# Patient Record
Sex: Male | Born: 1980
Health system: Southern US, Community
[De-identification: ages and names within clinical notes are randomized; demographics above are authoritative.]

## PROBLEM LIST (undated history)

## (undated) DIAGNOSIS — F410 Panic disorder [episodic paroxysmal anxiety] without agoraphobia: Secondary | ICD-10-CM

---

## 2004-10-10 ENCOUNTER — Emergency Department (HOSPITAL_COMMUNITY): Admission: EM | Admit: 2004-10-10 | Discharge: 2004-10-10 | Payer: Self-pay | Admitting: Emergency Medicine

## 2004-10-11 ENCOUNTER — Emergency Department (HOSPITAL_COMMUNITY): Admission: EM | Admit: 2004-10-11 | Discharge: 2004-10-11 | Payer: Self-pay | Admitting: Emergency Medicine

## 2004-12-17 ENCOUNTER — Emergency Department (HOSPITAL_COMMUNITY): Admission: EM | Admit: 2004-12-17 | Discharge: 2004-12-17 | Payer: Self-pay | Admitting: Emergency Medicine

## 2006-01-26 ENCOUNTER — Emergency Department (HOSPITAL_COMMUNITY): Admission: EM | Admit: 2006-01-26 | Discharge: 2006-01-26 | Payer: Self-pay | Admitting: Emergency Medicine

## 2007-08-21 ENCOUNTER — Emergency Department (HOSPITAL_COMMUNITY): Admission: EM | Admit: 2007-08-21 | Discharge: 2007-08-21 | Payer: Self-pay | Admitting: Emergency Medicine

## 2007-09-30 ENCOUNTER — Encounter (INDEPENDENT_AMBULATORY_CARE_PROVIDER_SITE_OTHER): Payer: Self-pay | Admitting: Nurse Practitioner

## 2007-09-30 ENCOUNTER — Ambulatory Visit: Payer: Self-pay | Admitting: *Deleted

## 2007-09-30 ENCOUNTER — Ambulatory Visit: Payer: Self-pay | Admitting: Family Medicine

## 2007-09-30 LAB — CONVERTED CEMR LAB
AST: 29 units/L (ref 0–37)
Albumin: 4.7 g/dL (ref 3.5–5.2)
Alkaline Phosphatase: 81 units/L (ref 39–117)
Calcium: 9.9 mg/dL (ref 8.4–10.5)
Chloride: 103 meq/L (ref 96–112)
Eosinophils Absolute: 0 10*3/uL — ABNORMAL LOW (ref 0.2–0.7)
Glucose, Bld: 93 mg/dL (ref 70–99)
Lymphocytes Relative: 51 % — ABNORMAL HIGH (ref 12–46)
Lymphs Abs: 2 10*3/uL (ref 0.7–4.0)
MCV: 88 fL (ref 78.0–100.0)
Neutro Abs: 1.5 10*3/uL — ABNORMAL LOW (ref 1.7–7.7)
Neutrophils Relative %: 37 % — ABNORMAL LOW (ref 43–77)
Platelets: 251 10*3/uL (ref 150–400)
Potassium: 4.4 meq/L (ref 3.5–5.3)
RBC: 5.85 M/uL — ABNORMAL HIGH (ref 4.22–5.81)
Sodium: 139 meq/L (ref 135–145)
Total Protein: 8.3 g/dL (ref 6.0–8.3)
WBC: 3.9 10*3/uL — ABNORMAL LOW (ref 4.0–10.5)

## 2007-10-06 ENCOUNTER — Ambulatory Visit (HOSPITAL_COMMUNITY): Admission: RE | Admit: 2007-10-06 | Discharge: 2007-10-06 | Payer: Self-pay | Admitting: Nurse Practitioner

## 2008-04-13 ENCOUNTER — Emergency Department (HOSPITAL_COMMUNITY): Admission: EM | Admit: 2008-04-13 | Discharge: 2008-04-13 | Payer: Self-pay | Admitting: Emergency Medicine

## 2008-07-05 ENCOUNTER — Ambulatory Visit: Payer: Self-pay | Admitting: Internal Medicine

## 2008-07-29 ENCOUNTER — Emergency Department (HOSPITAL_COMMUNITY): Admission: EM | Admit: 2008-07-29 | Discharge: 2008-07-29 | Payer: Self-pay | Admitting: Emergency Medicine

## 2008-08-23 ENCOUNTER — Emergency Department (HOSPITAL_COMMUNITY): Admission: EM | Admit: 2008-08-23 | Discharge: 2008-08-23 | Payer: Self-pay | Admitting: Emergency Medicine

## 2009-03-14 ENCOUNTER — Emergency Department (HOSPITAL_COMMUNITY): Admission: EM | Admit: 2009-03-14 | Discharge: 2009-03-14 | Payer: Self-pay | Admitting: Emergency Medicine

## 2009-05-03 ENCOUNTER — Emergency Department (HOSPITAL_COMMUNITY): Admission: EM | Admit: 2009-05-03 | Discharge: 2009-05-03 | Payer: Self-pay | Admitting: Emergency Medicine

## 2009-05-26 ENCOUNTER — Emergency Department (HOSPITAL_COMMUNITY): Admission: EM | Admit: 2009-05-26 | Discharge: 2009-05-26 | Payer: Self-pay | Admitting: Emergency Medicine

## 2009-07-13 ENCOUNTER — Emergency Department (HOSPITAL_COMMUNITY): Admission: EM | Admit: 2009-07-13 | Discharge: 2009-07-15 | Payer: Self-pay | Admitting: Emergency Medicine

## 2009-08-08 ENCOUNTER — Emergency Department (HOSPITAL_COMMUNITY): Admission: EM | Admit: 2009-08-08 | Discharge: 2009-08-08 | Payer: Self-pay | Admitting: Emergency Medicine

## 2009-08-10 ENCOUNTER — Emergency Department (HOSPITAL_COMMUNITY): Admission: EM | Admit: 2009-08-10 | Discharge: 2009-08-10 | Payer: Self-pay | Admitting: Emergency Medicine

## 2009-09-08 ENCOUNTER — Emergency Department (HOSPITAL_COMMUNITY): Admission: EM | Admit: 2009-09-08 | Discharge: 2009-09-08 | Payer: Self-pay | Admitting: Emergency Medicine

## 2009-10-09 ENCOUNTER — Emergency Department (HOSPITAL_COMMUNITY): Admission: EM | Admit: 2009-10-09 | Discharge: 2009-10-09 | Payer: Self-pay | Admitting: Emergency Medicine

## 2009-12-02 ENCOUNTER — Emergency Department (HOSPITAL_COMMUNITY): Admission: EM | Admit: 2009-12-02 | Discharge: 2009-12-02 | Payer: Self-pay | Admitting: Emergency Medicine

## 2010-08-30 ENCOUNTER — Emergency Department (HOSPITAL_COMMUNITY): Admission: EM | Admit: 2010-08-30 | Discharge: 2010-08-30 | Payer: Self-pay | Admitting: Emergency Medicine

## 2011-02-07 LAB — POCT I-STAT, CHEM 8
BUN: 10 mg/dL (ref 6–23)
Calcium, Ion: 1.19 mmol/L (ref 1.12–1.32)
Chloride: 104 mEq/L (ref 96–112)
Glucose, Bld: 96 mg/dL (ref 70–99)
TCO2: 27 mmol/L (ref 0–100)

## 2011-02-07 LAB — GLUCOSE, CAPILLARY: Glucose-Capillary: 101 mg/dL — ABNORMAL HIGH (ref 70–99)

## 2011-08-06 LAB — COMPREHENSIVE METABOLIC PANEL
ALT: 37
Calcium: 9.3
GFR calc Af Amer: >60
Glucose, Bld: 102 — ABNORMAL HIGH
Sodium: 138
Total Protein: 7.6

## 2011-08-06 LAB — BASIC METABOLIC PANEL
BUN: 11
CO2: 29
Chloride: 102
Creatinine, Ser: 1.03

## 2011-08-06 LAB — POCT CARDIAC MARKERS
CKMB, poc: 4.8
CKMB, poc: 5.6
Myoglobin, poc: 125
Troponin i, poc: <0.05

## 2011-08-06 LAB — DIFFERENTIAL
Basophils Absolute: 0
Basophils Relative: 1
Eosinophils Absolute: 0.1
Eosinophils Absolute: 0
Lymphs Abs: 1.7
Monocytes Relative: 10
Neutro Abs: 1.6 — ABNORMAL LOW
Neutrophils Relative %: 35 — ABNORMAL LOW
Neutrophils Relative %: 43

## 2011-08-06 LAB — CBC
Hemoglobin: 15.3
MCHC: 32.4
MCHC: 32.4
MCV: 87.1
Platelets: 251
RDW: 14
RDW: 14.1

## 2011-11-18 ENCOUNTER — Emergency Department (HOSPITAL_COMMUNITY)
Admission: EM | Admit: 2011-11-18 | Discharge: 2011-11-18 | Disposition: A | Discharge status: Home / Self Care | Payer: Self-pay | Attending: Emergency Medicine | Admitting: Emergency Medicine

## 2011-11-18 ENCOUNTER — Encounter (HOSPITAL_COMMUNITY): Payer: Self-pay | Admitting: Emergency Medicine

## 2011-11-18 DIAGNOSIS — F411 Generalized anxiety disorder: Secondary | ICD-10-CM | POA: Insufficient documentation

## 2011-11-18 DIAGNOSIS — Z79899 Other long term (current) drug therapy: Secondary | ICD-10-CM | POA: Insufficient documentation

## 2011-11-18 DIAGNOSIS — F419 Anxiety disorder, unspecified: Secondary | ICD-10-CM

## 2011-11-18 HISTORY — DX: Panic disorder (episodic paroxysmal anxiety): F41.0

## 2011-11-18 MED ORDER — LORAZEPAM 1 MG PO TABS
2.0000 mg | ORAL_TABLET | Freq: Once | ORAL | Status: AC
  Administered 2011-11-18: 2 mg via ORAL
  Filled 2011-11-18: qty 1

## 2011-11-18 MED ORDER — LORAZEPAM 1 MG PO TABS: 1.0000 mg | ORAL_TABLET | Freq: Three times a day (TID) | ORAL | Status: DC | PRN

## 2011-11-18 NOTE — ED Notes (Signed)
Pt c/o feeling shaky all over and heart beating fast x 1 hour.  Pt states he has a history of panic attacks and has some personal issues going on.

## 2011-11-18 NOTE — ED Provider Notes (Signed)
History     CSN: [id number]  Arrival date & time 11/18/11  1620   First MD Initiated Contact with Patient 11/18/11 1700      Chief Complaint  Patient presents with  . Panic Attack    (Consider location/radiation/quality/duration/timing/severity/associated sxs/prior treatment) HPI... patient felt heart racing today.  No chest pain or shortness of breath. This had panic attacks in the past. Recent family problems his left him under slept and worried about his children.  No history of heart disease. Has used Ativan in the past for panic. He's feeling better now. No radiation of discomfort. Severity was moderate  Past Medical History  Diagnosis Date  . Panic attack     History reviewed. No pertinent past surgical history.  No family history on file.  History  Substance Use Topics  . Smoking status: Former Games developer  . Smokeless tobacco: Not on file  . Alcohol Use: Yes      Review of Systems  All other systems reviewed and are negative.    Allergies  Toradol  Home Medications   Current Outpatient Rx  Name Route Sig Dispense Refill  . LORAZEPAM 1 MG PO TABS Oral Take 1 tablet (1 mg total) by mouth 3 (three) times daily as needed for anxiety. 20 tablet 0    BP 144/79  Pulse 92  Temp(Src) 98.9 F (37.2 C) (Oral)  Resp 20  SpO2 100%  Physical Exam  Nursing note and vitals reviewed. Constitutional: He is oriented to person, place, and time. He appears well-developed and well-nourished.  HENT:  Head: Normocephalic and atraumatic.  Eyes: Conjunctivae and EOM are normal. Pupils are equal, round, and reactive to light.  Neck: Normal range of motion. Neck supple.  Cardiovascular: Normal rate and regular rhythm.   Pulmonary/Chest: Effort normal and breath sounds normal.  Abdominal: Soft. Bowel sounds are normal.  Musculoskeletal: Normal range of motion.  Neurological: He is alert and oriented to person, place, and time.  Skin: Skin is warm and dry.  Psychiatric: He  has a normal mood and affect.    ED Course  Procedures (including critical care time)  Labs Reviewed - No data to display No results found.  Date: 11/18/2011  Rate: 85  Rhythm: normal sinus rhythm  QRS Axis: normal  Intervals: QT prolonged  ST/T Wave abnormalities: normal  Conduction Disutrbances:none  Narrative Interpretation:   Old EKG Reviewed: none available   1. Anxiety       MDM  Patient normal physical exam. EKG was normal. Patient is stressed out about his family situation. Do not suspect a cardiac cause        Donnetta Hutching, MD 11/18/11 1851

## 2011-11-18 NOTE — ED Notes (Signed)
Pt's mom called and states he accidentally took Xanax "5mg " before coming to ED.  States he thought he was taking Ibuprofen.

## 2011-11-29 ENCOUNTER — Encounter (HOSPITAL_COMMUNITY): Payer: Self-pay | Admitting: *Deleted

## 2011-11-29 ENCOUNTER — Emergency Department (HOSPITAL_COMMUNITY)
Admission: EM | Admit: 2011-11-29 | Discharge: 2011-11-29 | Disposition: A | Discharge status: Home / Self Care | Payer: Self-pay | Attending: Emergency Medicine | Admitting: Emergency Medicine

## 2011-11-29 DIAGNOSIS — L299 Pruritus, unspecified: Secondary | ICD-10-CM | POA: Insufficient documentation

## 2011-11-29 DIAGNOSIS — L259 Unspecified contact dermatitis, unspecified cause: Secondary | ICD-10-CM | POA: Insufficient documentation

## 2011-11-29 DIAGNOSIS — Z79899 Other long term (current) drug therapy: Secondary | ICD-10-CM | POA: Insufficient documentation

## 2011-11-29 DIAGNOSIS — T7840XA Allergy, unspecified, initial encounter: Secondary | ICD-10-CM

## 2011-11-29 MED ORDER — PREDNISONE 10 MG PO TABS: 40.0000 mg | ORAL_TABLET | Freq: Every day | ORAL | Status: DC

## 2011-11-29 NOTE — ED Provider Notes (Signed)
History     CSN: [id number]  Arrival date & time 11/29/11  1708   First MD Initiated Contact with Patient 11/29/11 1830      Chief Complaint  Patient presents with  . Allergic Reaction    (Consider location/radiation/quality/duration/timing/severity/associated sxs/prior treatment) HPI Comments: Patient presents with a two-day history of rash and itching. He thinks he had an allergic reaction to a beer that he drink 2 days ago. Shortly after he drank a beer, he noticed a rash and itching develop on his arms and his upper back. He states that the rash does get better with Benadryl but then comes back again and now it started spreading on. Denies any swelling of his lips or stones. Denies any shortness of breath. He has had allergic reactions similarly to rock tomatoes. Denies any new exposures. Denies any new medications.  Patient is a 31 y.o. male presenting with allergic reaction. The history is provided by the patient.  Allergic Reaction The primary symptoms are  rash. The primary symptoms do not include shortness of breath, cough, abdominal pain, nausea, vomiting, diarrhea or dizziness.  Significant symptoms that are not present include rhinorrhea.    Past Medical History  Diagnosis Date  . Panic attack     History reviewed. No pertinent past surgical history.  No family history on file.  History  Substance Use Topics  . Smoking status: Former Games developer  . Smokeless tobacco: Not on file  . Alcohol Use: Yes      Review of Systems  Constitutional: Negative for fever, chills, diaphoresis and fatigue.  HENT: Negative for congestion, rhinorrhea and sneezing.   Eyes: Negative.   Respiratory: Negative for cough, chest tightness and shortness of breath.   Cardiovascular: Negative for chest pain and leg swelling.  Gastrointestinal: Negative for nausea, vomiting, abdominal pain, diarrhea and blood in stool.  Genitourinary: Negative for frequency, hematuria, flank pain and  difficulty urinating.  Musculoskeletal: Negative for back pain and arthralgias.  Skin: Positive for rash.  Neurological: Negative for dizziness, speech difficulty, weakness, numbness and headaches.    Allergies  Toradol  Home Medications   Current Outpatient Rx  Name Route Sig Dispense Refill  . BENADRYL PO Oral Take 10 mLs by mouth once. For allergic reaction    . LORAZEPAM 1 MG PO TABS Oral Take 1 mg by mouth 3 (three) times daily as needed. For anxiety    . LORAZEPAM 1 MG PO TABS Oral Take 1 mg by mouth 3 (three) times daily as needed. For anxiety    . PREDNISONE 10 MG PO TABS Oral Take 4 tablets (40 mg total) by mouth daily. 20 tablet 0    BP 133/86  Pulse 92  Temp(Src) 99.1 F (37.3 C) (Oral)  Resp 20  SpO2 99%  Physical Exam  Constitutional: He is oriented to person, place, and time. He appears well-developed and well-nourished.  HENT:  Head: Normocephalic and atraumatic.       No swelling of the face is noted. No edema of the lips or the tongue is noted. No trismus is noted uvula is midline  Eyes: Pupils are equal, round, and reactive to light.  Neck: Normal range of motion. Neck supple.  Cardiovascular: Normal rate, regular rhythm and normal heart sounds.   Pulmonary/Chest: Effort normal and breath sounds normal. No respiratory distress. He has no wheezes. He has no rales. He exhibits no tenderness.  Abdominal: Soft. Bowel sounds are normal. There is no tenderness. There is no rebound and no  guarding.  Musculoskeletal: Normal range of motion. He exhibits no edema.  Lymphadenopathy:    He has no cervical adenopathy.  Neurological: He is alert and oriented to person, place, and time.  Skin: Skin is warm and dry. Rash noted.       Diffuse macular-type rash on both arms. It is blanching. No petechiae or purpura is noted. No vesicular lesions are noted  Psychiatric: He has a normal mood and affect.    ED Course  Procedures (including critical care time)  Labs  Reviewed - No data to display No results found.   1. Allergic reaction       MDM  Patient well-appearing less likely allergic reaction. Do five-day course of steroids and patient will continue using Benadryl as needed for itching. Advised him to followup with primary care physician if no improvement or return here for any worsening symptoms        Rolan Bucco, MD 11/29/11 1843

## 2011-11-29 NOTE — ED Notes (Signed)
Pt has been having a itching rash for 2 days and has some redness and mild swelling to face that began today.  Pt states that he took a benadryl which helped some (he took this 2 hours ago).  No sob with this.

## 2012-06-20 ENCOUNTER — Emergency Department (HOSPITAL_COMMUNITY)
Admission: EM | Admit: 2012-06-20 | Discharge: 2012-06-20 | Disposition: A | Discharge status: Home / Self Care | Payer: Self-pay | Attending: Emergency Medicine | Admitting: Emergency Medicine

## 2012-06-20 ENCOUNTER — Encounter (HOSPITAL_COMMUNITY): Payer: Self-pay

## 2012-06-20 DIAGNOSIS — H538 Other visual disturbances: Secondary | ICD-10-CM | POA: Insufficient documentation

## 2012-06-20 NOTE — ED Provider Notes (Signed)
History     CSN: [id number]  Arrival date & time 06/20/12  1233   First MD Initiated Contact with Patient 06/20/12 1244      Chief Complaint  Patient presents with  . Eye Problem    (Consider location/radiation/quality/duration/timing/severity/associated sxs/prior treatment) HPI Pt reports he woke up this AM with blurry vision in his R eye. States he is seeing double when he covers his other eye. Denies any pain, has had some foreign body sensation. He has been rubbing it some this AM and noticed increased tearing. He has not had any drainage of pus. No known trauma. No improvement with saline flush.  Past Medical History  Diagnosis Date  . Panic attack     No past surgical history on file.  No family history on file.  History  Substance Use Topics  . Smoking status: Former Games developer  . Smokeless tobacco: Not on file  . Alcohol Use: Yes      Review of Systems All other systems reviewed and are negative except as noted in HPI.   Allergies  Ketorolac tromethamine  Home Medications   Current Outpatient Rx  Name Route Sig Dispense Refill  . NAPROXEN SODIUM 220 MG PO TABS Oral Take 220 mg by mouth 2 (two) times daily as needed. For pain      BP 145/92  Pulse 60  Temp 98.2 F (36.8 C) (Oral)  Resp 20  SpO2 100%  Physical Exam  Nursing note and vitals reviewed. Constitutional: He is oriented to person, place, and time. He appears well-developed and well-nourished.  HENT:  Head: Normocephalic and atraumatic.  Eyes: Conjunctivae and EOM are normal. Pupils are equal, round, and reactive to light. Right eye exhibits no discharge. Left eye exhibits no discharge.       Anterior chamber is clear, no cells; no foreign body, no corneal abrasions seen on fluorescein exam  Neck: Normal range of motion. Neck supple.  Cardiovascular: Normal rate, normal heart sounds and intact distal pulses.   Pulmonary/Chest: Effort normal and breath sounds normal.  Abdominal: Bowel sounds  are normal. He exhibits no distension. There is no tenderness.  Musculoskeletal: Normal range of motion. He exhibits no edema and no tenderness.  Neurological: He is alert and oriented to person, place, and time. He has normal strength. No cranial nerve deficit or sensory deficit.  Skin: Skin is warm and dry. No rash noted.  Psychiatric: He has a normal mood and affect.    ED Course  Procedures (including critical care time)  Labs Reviewed - No data to display No results found.   1. Blurry vision       MDM  Visual acuity is normal. No concerning findings on exam. Advised Ophtho followup if symptoms persist.         Charles B. Bernette Mayers, MD 06/20/12 1328

## 2012-06-20 NOTE — ED Notes (Signed)
Pt here for blurred vision in right eye, no other complaints, some tearing noted.

## 2012-07-06 ENCOUNTER — Emergency Department (HOSPITAL_COMMUNITY)
Admission: EM | Admit: 2012-07-06 | Discharge: 2012-07-06 | Disposition: A | Discharge status: Home / Self Care | Payer: Self-pay | Attending: Otolaryngology | Admitting: Otolaryngology

## 2012-07-06 DIAGNOSIS — S01409A Unspecified open wound of unspecified cheek and temporomandibular area, initial encounter: Secondary | ICD-10-CM | POA: Insufficient documentation

## 2012-07-06 DIAGNOSIS — S0181XA Laceration without foreign body of other part of head, initial encounter: Secondary | ICD-10-CM

## 2012-07-06 DIAGNOSIS — S0180XA Unspecified open wound of other part of head, initial encounter: Secondary | ICD-10-CM | POA: Insufficient documentation

## 2012-07-06 LAB — CBC WITH DIFFERENTIAL/PLATELET
Basophils Relative: 0 % (ref 0–1)
Eosinophils Absolute: 0 10*3/uL (ref 0.0–0.7)
Eosinophils Relative: 0 % (ref 0–5)
Lymphs Abs: 2.1 10*3/uL (ref 0.7–4.0)
MCH: 28.3 pg (ref 26.0–34.0)
MCHC: 33.2 g/dL (ref 30.0–36.0)
MCV: 85.4 fL (ref 78.0–100.0)
Platelets: 232 10*3/uL (ref 150–400)
RBC: 4.87 MIL/uL (ref 4.22–5.81)

## 2012-07-06 LAB — BASIC METABOLIC PANEL
BUN: 12 mg/dL (ref 6–23)
CO2: 21 mEq/L (ref 19–32)
Calcium: 9 mg/dL (ref 8.4–10.5)
Creatinine, Ser: 1.03 mg/dL (ref 0.50–1.35)
Glucose, Bld: 141 mg/dL — ABNORMAL HIGH (ref 70–99)
Sodium: 138 mEq/L (ref 135–145)

## 2012-07-06 MED ORDER — ONDANSETRON HCL 4 MG/2ML IJ SOLN
4.0000 mg | Freq: Once | INTRAMUSCULAR | Status: AC
  Administered 2012-07-06: 4 mg via INTRAVENOUS
  Filled 2012-07-06: qty 2

## 2012-07-06 MED ORDER — MORPHINE SULFATE 2 MG/ML IJ SOLN
2.0000 mg | Freq: Once | INTRAMUSCULAR | Status: AC
  Administered 2012-07-06: 2 mg via INTRAVENOUS
  Filled 2012-07-06: qty 1

## 2012-07-06 NOTE — ED Notes (Signed)
Suture care complete. Pt tolerated without difficulty. He is resting quietly at the time. Vital signs stable. Detective at bedside speaking with patient. No signs of distress noted at present.
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
## 2012-07-06 NOTE — ED Notes (Signed)
Pt states he feels nauseated.

## 2012-07-06 NOTE — ED Notes (Signed)
Pt fiance and father are visiting pt.
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
## 2012-07-06 NOTE — ED Notes (Signed)
Dr. Magnus Ivan arrived at bedside.

## 2012-07-06 NOTE — ED Notes (Addendum)
MD currently suturing lacerations on face. VSS.

## 2012-07-06 NOTE — ED Notes (Signed)
Gore MD at bedside.

## 2012-07-06 NOTE — ED Notes (Addendum)
Pt feeling nauseated at the time. Vital signs stable. Family at the bedside. Will continue to monitor. He remains alert and oriented x4. No signs of distress noted. VSS.

## 2012-07-06 NOTE — ED Notes (Signed)
Gore MD arrived and in room
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
## 2012-07-06 NOTE — ED Notes (Signed)
Patient belongings placed in brown paper bags, labeled, timed, dated and initialed by this tech and given to Officer E.J. Nichols with [employer].  Items include a cut shirt in one bag and a cell phone in second bag.  Patient informed of the need for the belongings being given to [employer] at this time.  No other belongings come with the patient to the ED.  RN Eston Esters informed.
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
## 2012-07-20 ENCOUNTER — Emergency Department (HOSPITAL_COMMUNITY)
Admission: EM | Admit: 2012-07-20 | Discharge: 2012-07-20 | Disposition: A | Discharge status: Home / Self Care | Payer: Self-pay | Attending: Emergency Medicine | Admitting: Emergency Medicine

## 2012-07-20 ENCOUNTER — Encounter (HOSPITAL_COMMUNITY): Payer: Self-pay | Admitting: Physical Medicine and Rehabilitation

## 2012-07-20 ENCOUNTER — Emergency Department (HOSPITAL_COMMUNITY): Payer: Self-pay

## 2012-07-20 DIAGNOSIS — R220 Localized swelling, mass and lump, head: Secondary | ICD-10-CM | POA: Insufficient documentation

## 2012-07-20 DIAGNOSIS — Z4789 Encounter for other orthopedic aftercare: Secondary | ICD-10-CM | POA: Insufficient documentation

## 2012-07-20 DIAGNOSIS — S0181XA Laceration without foreign body of other part of head, initial encounter: Secondary | ICD-10-CM

## 2012-07-20 DIAGNOSIS — S02609A Fracture of mandible, unspecified, initial encounter for closed fracture: Secondary | ICD-10-CM

## 2012-07-20 DIAGNOSIS — Z87891 Personal history of nicotine dependence: Secondary | ICD-10-CM | POA: Insufficient documentation

## 2012-07-20 LAB — BASIC METABOLIC PANEL
CO2: 26 mEq/L (ref 19–32)
Chloride: 102 mEq/L (ref 96–112)
Glucose, Bld: 97 mg/dL (ref 70–99)
Potassium: 4 mEq/L (ref 3.5–5.1)
Sodium: 138 mEq/L (ref 135–145)

## 2012-07-20 LAB — CBC
HCT: 41.2 % (ref 39.0–52.0)
Hemoglobin: 13.3 g/dL (ref 13.0–17.0)
RBC: 4.73 MIL/uL (ref 4.22–5.81)

## 2012-07-20 MED ORDER — CLINDAMYCIN HCL 150 MG PO CAPS
300.0000 mg | ORAL_CAPSULE | Freq: Once | ORAL | Status: AC
  Administered 2012-07-20: 300 mg via ORAL
  Filled 2012-07-20: qty 2

## 2012-07-20 MED ORDER — IOHEXOL 300 MG/ML  SOLN
75.0000 mL | Freq: Once | INTRAMUSCULAR | Status: AC | PRN
  Administered 2012-07-20: 75 mL via INTRAVENOUS

## 2012-07-20 MED ORDER — CLINDAMYCIN HCL 300 MG PO CAPS: 300.0000 mg | ORAL_CAPSULE | Freq: Three times a day (TID) | ORAL | Status: AC

## 2012-07-20 MED ORDER — OXYCODONE-ACETAMINOPHEN 5-325 MG PO TABS
1.0000 | ORAL_TABLET | Freq: Once | ORAL | Status: DC
  Filled 2012-07-20: qty 1
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
## 2012-07-20 NOTE — ED Notes (Signed)
Pt presents to department for evaluation of possible abscess to neck. States he had sutures placed to L cheek and neck x2 weeks ago. Now states large "knot" with yellow colored drainage. Denies fever. Denies pain at the time. Pt is alert and oriented x4.

## 2012-07-20 NOTE — ED Notes (Signed)
Pt states he feels a lump in his throat. Pt states it is not painful. Pt states when he presses on his neck he feels this fluid coming from this lump. Pt stable. No signs of distress noted

## 2012-07-20 NOTE — ED Notes (Signed)
Pt returned from CT at this time.  

## 2015-02-15 ENCOUNTER — Emergency Department (HOSPITAL_COMMUNITY)
Admission: EM | Admit: 2015-02-15 | Discharge: 2015-02-15 | Disposition: A | Discharge status: Home / Self Care | Payer: No Typology Code available for payment source | Attending: Emergency Medicine | Admitting: Emergency Medicine

## 2015-02-15 ENCOUNTER — Encounter (HOSPITAL_COMMUNITY): Payer: Self-pay | Admitting: *Deleted

## 2015-02-15 DIAGNOSIS — Y9389 Activity, other specified: Secondary | ICD-10-CM | POA: Insufficient documentation

## 2015-02-15 DIAGNOSIS — Y998 Other external cause status: Secondary | ICD-10-CM | POA: Diagnosis not present

## 2015-02-15 DIAGNOSIS — Y92410 Unspecified street and highway as the place of occurrence of the external cause: Secondary | ICD-10-CM | POA: Diagnosis not present

## 2015-02-15 DIAGNOSIS — Z8659 Personal history of other mental and behavioral disorders: Secondary | ICD-10-CM | POA: Insufficient documentation

## 2015-02-15 DIAGNOSIS — S4992XA Unspecified injury of left shoulder and upper arm, initial encounter: Secondary | ICD-10-CM | POA: Insufficient documentation

## 2015-02-15 DIAGNOSIS — S199XXA Unspecified injury of neck, initial encounter: Secondary | ICD-10-CM | POA: Insufficient documentation

## 2015-02-15 DIAGNOSIS — M62838 Other muscle spasm: Secondary | ICD-10-CM | POA: Diagnosis not present

## 2015-02-15 DIAGNOSIS — Z87891 Personal history of nicotine dependence: Secondary | ICD-10-CM | POA: Diagnosis not present

## 2015-02-15 MED ORDER — HYDROCODONE-ACETAMINOPHEN 5-325 MG PO TABS: 1.0000 | ORAL_TABLET | Freq: Four times a day (QID) | ORAL | Status: DC | PRN

## 2015-02-15 MED ORDER — CYCLOBENZAPRINE HCL 10 MG PO TABS: 10.0000 mg | ORAL_TABLET | Freq: Two times a day (BID) | ORAL | Status: AC | PRN
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
## 2015-02-15 NOTE — Discharge Instructions (Signed)

## 2015-02-15 NOTE — ED Notes (Signed)
Pt verbalized understanding of all discharge instructions including follow up and reasons to return to the ED.

## 2016-08-29 ENCOUNTER — Encounter (HOSPITAL_COMMUNITY): Payer: Self-pay | Admitting: Emergency Medicine

## 2016-08-29 DIAGNOSIS — M25552 Pain in left hip: Secondary | ICD-10-CM | POA: Insufficient documentation

## 2016-08-29 DIAGNOSIS — S79912A Unspecified injury of left hip, initial encounter: Secondary | ICD-10-CM | POA: Diagnosis present

## 2016-08-29 DIAGNOSIS — Z87891 Personal history of nicotine dependence: Secondary | ICD-10-CM | POA: Diagnosis not present

## 2016-08-29 DIAGNOSIS — Y939 Activity, unspecified: Secondary | ICD-10-CM | POA: Diagnosis not present

## 2016-08-29 DIAGNOSIS — Y92410 Unspecified street and highway as the place of occurrence of the external cause: Secondary | ICD-10-CM | POA: Insufficient documentation

## 2016-08-29 DIAGNOSIS — Z79899 Other long term (current) drug therapy: Secondary | ICD-10-CM | POA: Insufficient documentation

## 2016-08-29 DIAGNOSIS — R0789 Other chest pain: Secondary | ICD-10-CM | POA: Diagnosis not present

## 2016-08-29 DIAGNOSIS — Y999 Unspecified external cause status: Secondary | ICD-10-CM | POA: Insufficient documentation
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
## 2016-08-30 ENCOUNTER — Emergency Department (HOSPITAL_COMMUNITY): Payer: No Typology Code available for payment source

## 2016-08-30 ENCOUNTER — Emergency Department (HOSPITAL_COMMUNITY)
Admission: EM | Admit: 2016-08-30 | Discharge: 2016-08-30 | Disposition: A | Discharge status: Home / Self Care | Payer: No Typology Code available for payment source | Attending: Emergency Medicine | Admitting: Emergency Medicine

## 2016-08-30 MED ORDER — IBUPROFEN 600 MG PO TABS: 600.0000 mg | ORAL_TABLET | Freq: Four times a day (QID) | ORAL | 0 refills | Status: AC | PRN

## 2016-08-30 MED ORDER — METHOCARBAMOL 500 MG PO TABS: 500.0000 mg | ORAL_TABLET | Freq: Two times a day (BID) | ORAL | 0 refills | Status: DC

## 2016-08-30 NOTE — ED Notes (Signed)
Pt. Refused getting into gown.RN,Joanna made aware.
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
## 2016-08-30 NOTE — Discharge Instructions (Signed)
Take your medications as prescribed as needed for pain relief. I recommend applying ice to affected area for 15 minutes 3-4 times daily for additional pain relief. Please follow up with a primary care provider from the Resource Guide provided below in 1 week if your symptoms have not improved. Please return to the Emergency Department if symptoms worsen or new onset of fever, chest pain, difficulty breathing, neck stiffness, back pain, abdominal pain, vomiting, numbness, tingling, weakness.

## 2016-08-30 NOTE — ED Notes (Signed)
Patient transported to X-ray 
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
## 2017-05-25 ENCOUNTER — Encounter (HOSPITAL_COMMUNITY): Payer: Self-pay | Admitting: *Deleted

## 2017-05-25 ENCOUNTER — Ambulatory Visit (HOSPITAL_COMMUNITY)
Admission: EM | Admit: 2017-05-25 | Discharge: 2017-05-25 | Disposition: A | Discharge status: Home / Self Care | Payer: 59 | Attending: Internal Medicine | Admitting: Internal Medicine

## 2017-05-25 DIAGNOSIS — H00036 Abscess of eyelid left eye, unspecified eyelid: Secondary | ICD-10-CM | POA: Diagnosis not present

## 2017-05-25 MED ORDER — TOBRAMYCIN 0.3 % OP OINT
TOPICAL_OINTMENT | OPHTHALMIC | Status: AC
  Filled 2017-05-25: qty 3.5

## 2017-05-25 MED ORDER — TOBRAMYCIN 0.3 % OP OINT
TOPICAL_OINTMENT | Freq: Three times a day (TID) | OPHTHALMIC | Status: DC
  Administered 2017-05-25: 20:00:00 via OPHTHALMIC

## 2017-05-25 MED ORDER — AMOXICILLIN-POT CLAVULANATE 875-125 MG PO TABS: 1.0000 | ORAL_TABLET | Freq: Two times a day (BID) | ORAL | 0 refills | Status: AC

## 2017-05-25 NOTE — ED Notes (Signed)
Visual  Acuity  20/25  r     20/25 l

## 2017-05-25 NOTE — ED Triage Notes (Signed)
Pt  Noticed     Swelling  Around   l   Eye       Upper  Lid        Started   Off      l  Upper      Lid         Red      And   Is     painfull

## 2017-05-25 NOTE — Discharge Instructions (Addendum)
Put warm compresses on affected area and follow up with opthalmology on Monday. If symptoms worsen go the ED.

## 2017-05-25 NOTE — ED Provider Notes (Signed)
CSN: [id number]     Arrival date & time 05/25/17  1843 History   None    Chief Complaint  Patient presents with  . Eye Problem   (Consider location/radiation/quality/duration/timing/severity/associated sxs/prior Treatment) 36 y.o. male presents with swelling and erythema to the left upper X 4 days. Condition is acute in nature. Condition is made better by nothing. Condition is made worse by nothing. Patient denies any relief from boiled egg placed on the eye prior to there arrival at this facility. Patient states that he recently had an abscess to his eye that he popped and that he had been trying to pop this abscess but has been unsuccessful. Patient denies any fevers or loss of vision.        Past Medical History:  Diagnosis Date  . Panic attack    History reviewed. No pertinent surgical history. History reviewed. No pertinent family history. Social History  Substance Use Topics  . Smoking status: Never Smoker  . Smokeless tobacco: Never Used  . Alcohol use Yes    Review of Systems  Constitutional: Negative for chills and fever.  HENT: Negative for ear pain and sore throat.   Eyes: Positive for discharge. Negative for photophobia, pain, redness and visual disturbance.       Swelling and erythema to left upper eyelid  Respiratory: Negative for cough and shortness of breath.   Cardiovascular: Negative for chest pain and palpitations.  Gastrointestinal: Negative for abdominal pain and vomiting.  Genitourinary: Negative for dysuria and hematuria.  Musculoskeletal: Negative for arthralgias and back pain.  Skin: Negative for color change and rash.  Neurological: Negative for seizures and syncope.  All other systems reviewed and are negative.   Allergies  Ketorolac tromethamine and Toradol [ketorolac tromethamine]  Home Medications   Prior to Admission medications   Medication Sig Start Date End Date Taking? Authorizing Provider  amoxicillin-clavulanate (AUGMENTIN)  875-125 MG tablet Take 1 tablet by mouth 2 (two) times daily. 05/25/17 06/04/17  Alene Mires, NP  cyclobenzaprine (FLEXERIL) 10 MG tablet Take 1 tablet (10 mg total) by mouth 2 (two) times daily as needed for muscle spasms. 02/15/15   Elwin Mocha, MD  HYDROcodone-acetaminophen (NORCO/VICODIN) 5-325 MG per tablet Take 1 tablet by mouth every 6 (six) hours as needed for moderate pain. 02/15/15   Elwin Mocha, MD  ibuprofen (ADVIL,MOTRIN) 600 MG tablet Take 1 tablet (600 mg total) by mouth every 6 (six) hours as needed. 08/30/16   Barrett Henle, PA-C  methocarbamol (ROBAXIN) 500 MG tablet Take 1 tablet (500 mg total) by mouth 2 (two) times daily. 08/30/16   Barrett Henle, PA-C  naproxen sodium (ANAPROX) 220 MG tablet Take 220 mg by mouth 2 (two) times daily as needed. For pain    [provider]   Meds Ordered and Administered this Visit   Medications  tobramycin (TOBREX) 0.3 % ophthalmic ointment (not administered)    BP (!) 170/80 (BP Location: Right Arm)   Pulse 78   Temp 99.2 F (37.3 C) (Oral)   Resp 18   SpO2 100%  No data found.   Physical Exam  Constitutional: He is oriented to person, place, and time. He appears well-developed and well-nourished.  HENT:  Head: Normocephalic.  Swelling and erythema noted to left upper eyelid. Clear discharge noted fluctuant area noted near lacrimal duct.  Neck: Normal range of motion.  Pulmonary/Chest: Effort normal.  Musculoskeletal: Normal range of motion.  Neurological: He is alert and oriented to person, place, and  time.  Skin: Skin is dry.  Psychiatric: He has a normal mood and affect.  Nursing note and vitals reviewed.   Urgent Care Course     Procedures (including critical care time)  Labs Review Labs Reviewed - No data to display  Imaging Review No results found.   Visual Acuity Review  Noted by nurse     MDM   1. Abscess of left eyelid       Alene MiresOmohundro, Lillard Bailon C,  NP 05/25/17 2003

## 2018-08-03 ENCOUNTER — Encounter (HOSPITAL_COMMUNITY): Payer: Self-pay | Admitting: Emergency Medicine

## 2018-08-03 ENCOUNTER — Ambulatory Visit (HOSPITAL_COMMUNITY)
Admission: EM | Admit: 2018-08-03 | Discharge: 2018-08-03 | Disposition: A | Discharge status: Home / Self Care | Payer: 59 | Attending: Family Medicine | Admitting: Family Medicine

## 2018-08-03 DIAGNOSIS — S39012A Strain of muscle, fascia and tendon of lower back, initial encounter: Secondary | ICD-10-CM

## 2018-08-03 DIAGNOSIS — M5432 Sciatica, left side: Secondary | ICD-10-CM

## 2018-08-03 DIAGNOSIS — M5431 Sciatica, right side: Secondary | ICD-10-CM

## 2018-08-03 MED ORDER — METHOCARBAMOL 500 MG PO TABS: 500.0000 mg | ORAL_TABLET | Freq: Two times a day (BID) | ORAL | 0 refills | Status: AC

## 2018-08-03 MED ORDER — METHYLPREDNISOLONE SODIUM SUCC 125 MG IJ SOLR
125.0000 mg | Freq: Once | INTRAMUSCULAR | Status: AC
  Administered 2018-08-03: 125 mg via INTRAMUSCULAR

## 2018-08-03 MED ORDER — METHYLPREDNISOLONE SODIUM SUCC 125 MG IJ SOLR
INTRAMUSCULAR | Status: AC
  Filled 2018-08-03: qty 2

## 2018-08-03 MED ORDER — ACETAMINOPHEN-CODEINE #3 300-30 MG PO TABS: 1.0000 | ORAL_TABLET | Freq: Four times a day (QID) | ORAL | 0 refills | Status: AC | PRN

## 2018-08-03 MED ORDER — PREDNISONE 20 MG PO TABS: ORAL_TABLET | ORAL | 0 refills | Status: AC

## 2018-08-03 NOTE — Discharge Instructions (Signed)
If pain persist after current treatment I do recommend that he follow-up with her primary care provider as you likely may benefit from physical therapy and or further work-up by an orthopedic specialist. 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  I prescribed a small quantity of Tylenol No. 3 to which only been taking in the presence of very severe pain medication is sedating.

## 2018-08-03 NOTE — ED Provider Notes (Signed)
[facility]    CSN: [id number] Arrival date & time: 08/03/18  1541     History   Chief Complaint Chief Complaint  Patient presents with  . Back Pain  HPI Marvin Cochran is a 37 y.o. male.   HPI Back pain Patient complains that he suffers from chronic back pain and reports most recent exacerbation occurred x1 week ago.  Current pain 10/10. He works in a [employer] and is constantly lifting heavy furniture and moving objects.  He reports gradually over the last 7 days his back pain has worsened. Pain is mostly located to his lower lumbar region and exacerbated by positional changes and lying on his left side.He denies any numbness and tingling of his lower extremities.  In the past he has been treated with cyclobenzaprine however this is not helped current symptoms.  He is also been taken ibuprofen without significant relief and doing warm water Epson salt baths. Past Medical History:  Diagnosis Date  . Panic attack     There are no active problems to display for this patient.   History reviewed. No pertinent surgical history.     Home Medications    Prior to Admission medications   Medication Sig Start Date End Date Taking? Authorizing Provider  cyclobenzaprine (FLEXERIL) 10 MG tablet Take 1 tablet (10 mg total) by mouth 2 (two) times daily as needed for muscle spasms. Patient not taking: Reported on 08/03/2018 02/15/15   Elwin Mocha, MD  HYDROcodone-acetaminophen (NORCO/VICODIN) 5-325 MG per tablet Take 1 tablet by mouth every 6 (six) hours as needed for moderate pain. Patient not taking: Reported on 08/03/2018 02/15/15   Elwin Mocha, MD  ibuprofen (ADVIL,MOTRIN) 600 MG tablet Take 1 tablet (600 mg total) by mouth every 6 (six) hours as needed. 08/30/16   Barrett Henle, PA-C  methocarbamol (ROBAXIN) 500 MG tablet Take 1 tablet (500 mg total) by mouth 2 (two) times daily. Patient not taking: Reported on 08/03/2018 08/30/16   Barrett Henle, PA-C  naproxen sodium (ANAPROX) 220 MG tablet Take 220 mg by mouth 2 (two) times daily as needed. For pain    [provider]    Family History No family history on file.  Social History Social History   Tobacco Use  . Smoking status: Never Smoker  . Smokeless tobacco: Never Used  Substance Use Topics  . Alcohol use: Yes  . Drug use: No     Allergies   Ketorolac tromethamine and Toradol [ketorolac tromethamine]   Review of Systems Review of Systems Pertinent negatives listed in HPI Physical Exam Triage Vital Signs ED Triage Vitals [08/03/18 1619]  Enc Vitals Group     BP (!) 155/78     Pulse Rate 60     Resp 16     Temp 98.1 F (36.7 C)     Temp src      SpO2 100 %     Weight      Height      Head Circumference      Peak Flow      Pain Score      Pain Loc      Pain Edu?      Excl. in GC?    No data found.  Updated Vital Signs BP (!) 155/78   Pulse 60   Temp 98.1 F (36.7 C)   Resp 16   SpO2 100%   Visual Acuity Right Eye Distance:   Left Eye Distance:  Bilateral Distance:    Right Eye Near:   Left Eye Near:    Bilateral Near:     Physical Exam General appearance: alert, well developed, well nourished, cooperative and in no distress Head: Normocephalic, without obvious abnormality, atraumatic Respiratory: Respirations even and unlabored, normal respiratory rate Extremities: Lumbar spine: No gross deformities. Straight leg raises negative. Pain with ambulation and ROM Skin: Skin color, texture, turgor normal. No rashes seen  Psych: Appropriate mood and affect. Neurologic: Mental status: Alert, oriented to person, place, and time, thought content appropriate.BLE: strength 5/5 and patellar tendon reflexes +2 UC Treatments / Results  Labs (all labs ordered are listed, but only abnormal results are displayed) Labs Reviewed - No data to display  EKG None  Radiology No results found.  Procedures Procedures (including  critical care time)  Medications Ordered in UC Medications - No data to display  Initial Impression / Assessment and Plan / UC Course  I have reviewed the triage vital signs and the nursing notes.  Pertinent labs & imaging results that were available during my care of the patient were reviewed by me and considered in my medical decision making (see chart for details).   Patient presents today with acute onset lumbar spine pain which is likely related to sciatica and lumbar strain.  He has had no acute back injury.  Administer Solu-Medrol 125 mg IM, here in office.  Start a prednisone taper on tomorrow x9 days.  For back pain management will prescribe methocarbamol 500 mg twice daily as needed as well as a short course of Tylenol 3's for more severe pain.  Patient requested a work note agreed to write out of work for 1 day patient will return 08/05/2018. Final Clinical Impressions(s) / UC Diagnoses   Final diagnoses:  Strain of lumbar region, initial encounter  Bilateral sciatica     Discharge Instructions     If pain persist after current treatment I do recommend that he follow-up with her primary care provider as you likely may benefit from physical therapy and or further work-up by an orthopedic specialist. 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  I prescribed a small quantity of Tylenol No. 3 to which only been taking in the presence of very severe pain medication is sedating.    ED Prescriptions    Medication Sig Dispense Auth. Provider   predniSONE (DELTASONE) 20 MG tablet Take 3 PO QAM x3days, 2 PO QAM x3days, 1 PO QAM x3days 18 tablet Bing Neighbors, FNP   methocarbamol (ROBAXIN) 500 MG tablet Take 1 tablet (500 mg total) by mouth 2 (two) times daily. 20 tablet Bing Neighbors, FNP   acetaminophen-codeine (TYLENOL #3) 300-30 MG tablet Take 1-2 tablets by mouth every 6 (six) hours as needed for moderate pain. 15 tablet Bing Neighbors, FNP     Controlled Substance Prescriptions [place] Controlled Substance Registry consulted? Yes, I have consulted the [place] Controlled Substances Registry for this patient, and feel the risk/benefit ratio today is favorable for proceeding with this prescription for a controlled substance.   Bing Neighbors, [place] 08/04/18 [phone]

## 2018-08-03 NOTE — ED Triage Notes (Signed)
Pt states off and on back pain for two years, states he does a lot of lifting for work. Pt states it came back Monday and its gotten worse.

## 2019-01-19 ENCOUNTER — Encounter (HOSPITAL_COMMUNITY): Payer: Self-pay | Admitting: Emergency Medicine

## 2019-01-19 ENCOUNTER — Ambulatory Visit (HOSPITAL_COMMUNITY)
Admission: EM | Admit: 2019-01-19 | Discharge: 2019-01-19 | Disposition: A | Discharge status: Home / Self Care | Payer: 59 | Attending: Family Medicine | Admitting: Family Medicine

## 2019-01-19 ENCOUNTER — Other Ambulatory Visit: Payer: Self-pay

## 2019-01-19 DIAGNOSIS — R51 Headache: Secondary | ICD-10-CM | POA: Diagnosis not present

## 2019-01-19 DIAGNOSIS — R0981 Nasal congestion: Secondary | ICD-10-CM

## 2019-01-19 DIAGNOSIS — R42 Dizziness and giddiness: Secondary | ICD-10-CM | POA: Diagnosis not present

## 2019-01-19 LAB — GLUCOSE, CAPILLARY: GLUCOSE-CAPILLARY: 114 mg/dL — AB (ref 70–99)

## 2019-01-19 NOTE — ED Triage Notes (Signed)
Patient reports that for a week has episodes of feeling real hot, lightheaded, dizzy.  Reports when he turns his head from side to side, it appears vision is lagging behind.  Patient has had intermittent headache and facial pressure.

## 2019-01-19 NOTE — ED Provider Notes (Signed)
[facility]   [id number] 01/19/19 Arrival Time: 1227  ASSESSMENT & PLAN:  1. Lightheadedness   2. Nasal congestion    Suspect symptoms related to overall feeling congested. Normal neurological exam. No indication for neurodiagnostic imaging at this time. Declines baseline CBC, BMP, TSH today. Prefers to monitor symptoms. Work note provided. Will do his best to ensure adequate sleep and fluid intake. Will start taking Claritin daily. See AVS for d/c instructions. Will f/u here if not improving over the next few days.  Agrees to proceed to the ED if he develops other symptoms such as alterations of speech, swallowing, vision, motor/sensory systems, or if dizziness worsens.  Reviewed expectations re: course of current medical issues. Questions answered. Outlined signs and symptoms indicating need for more acute intervention. Patient verbalized understanding. After Visit Summary given.   SUBJECTIVE:  Marvin Cochran is a 38 y.o. male who presents for evaluation of dizziness described as lightheadedness. No dysequilibrium. Symptoms began about one week ago he thinks and have been intermittent. Frequency: usu lasting a few minutes then resolving; sometimes feels "woozy" for a few hours but this has not limited his daily activities or work. Aggravating factors: occasionally symptoms worsen or are precipitated by turing his head to one side. Does reports mild to moderate nasal/sinus congestion over the past several days. Mild frontal headache. No n/v. Normal PO intake. Sleeping well at night; symptoms do not wake him. No CP/SOB/palpitations reported. Previous workup/treatments: none. Associated ear symptoms: none. Denies: confusion, dimming vision, drowsiness, paresthesias, speech change and visual floaters. Denies otalgia and tinnitus. Recent infections: none. Head trauma: denied. Drug ingestion: none. Noise exposure: no occupational exposure. Family history: no h/o vertigo reported.  No new medications. No OTC treatment.  ROS: As per HPI. All other systems negative.   OBJECTIVE:  Vitals:   01/19/19 1317  BP: 129/73  Pulse: 66  Resp: 20  Temp: (!) 97.4 F (36.3 C)  TempSrc: Temporal  SpO2: 100%    General appearance: alert; no distress Eyes: PERRLA; EOMI; conjunctiva normal HENT: normocephalic; atraumatic; TMs normal; nasal mucosa normal; oral mucosa normal Neck: supple with FROM Lungs: clear to auscultation bilaterally Heart: regular rate and rhythm Abdomen: soft, non-tender; bowel sounds normal Extremities: no cyanosis or edema; symmetrical with no gross deformities Skin: warm and dry Neurologic: normal gait; DTR's normal and symmetric.; CN 2-12 grossly intact Psychological: alert and cooperative; normal mood and affect  Labs Reviewed  CBG MONITORING, ED    Allergies  Allergen Reactions  . Ketorolac Tromethamine Other (See Comments)    "severe migraine"  . Toradol [Ketorolac Tromethamine] Other (See Comments)    Headache     Past Medical History:  Diagnosis Date  . Panic attack    Social History   Socioeconomic History  . Marital status: Single    Spouse name: Not on file  . Number of children: Not on file  . Years of education: Not on file  . Highest education level: Not on file  Occupational History  . Not on file  Social Needs  . Financial resource strain: Not on file  . Food insecurity:    Worry: Not on file    Inability: Not on file  . Transportation needs:    Medical: Not on file    Non-medical: Not on file  Tobacco Use  . Smoking status: Never Smoker  . Smokeless tobacco: Never Used  Substance and Sexual Activity  . Alcohol use: Yes  . Drug use: No  . Sexual  activity: Not on file  Lifestyle  . Physical activity:    Days per week: Not on file    Minutes per session: Not on file  . Stress: Not on file  Relationships  . Social connections:    Talks on phone: Not on file    Gets together: Not on file    Attends  religious service: Not on file    Active member of club or organization: Not on file    Attends meetings of clubs or organizations: Not on file    Relationship status: Not on file  . Intimate partner violence:    Fear of current or ex partner: Not on file    Emotionally abused: Not on file    Physically abused: Not on file    Forced sexual activity: Not on file  Other Topics Concern  . Not on file  Social History Narrative  . Not on file   Family History  Problem Relation Age of Onset  . Hypertension Mother   . Hypertension Father    History reviewed. No pertinent surgical history.    Mardella Layman, MD 01/19/19 1414

## 2019-01-19 NOTE — Discharge Instructions (Addendum)
  Please seek prompt medical care if: You have: A very bad (severe) headache that is not helped by medicine. Trouble walking or weakness in your arms and legs. Clear or bloody fluid coming from your nose or ears. Changes in your seeing (vision). Jerky movements that you cannot control (seizure). You throw up (vomit). Your symptoms get worse. You lose balance. Your speech is slurred. You pass out. You are sleepier and have trouble staying awake. The black centers of your eyes (pupils) change in size.  These symptoms may be an emergency. Do not wait to see if the symptoms will go away. Get medical help right away. Call your local emergency services. Do not drive yourself to the hospital.  

## 2024-11-14 ENCOUNTER — Encounter (HOSPITAL_COMMUNITY): Payer: Self-pay | Admitting: Emergency Medicine

## 2024-11-14 ENCOUNTER — Emergency Department (HOSPITAL_COMMUNITY)
Admission: EM | Admit: 2024-11-14 | Discharge: 2024-11-15 | Payer: Self-pay | Attending: Emergency Medicine | Admitting: Emergency Medicine

## 2024-11-14 ENCOUNTER — Emergency Department (HOSPITAL_COMMUNITY): Payer: Self-pay

## 2024-11-14 ENCOUNTER — Other Ambulatory Visit: Payer: Self-pay

## 2024-11-14 DIAGNOSIS — Z5321 Procedure and treatment not carried out due to patient leaving prior to being seen by health care provider: Secondary | ICD-10-CM | POA: Insufficient documentation

## 2024-11-14 DIAGNOSIS — R0602 Shortness of breath: Secondary | ICD-10-CM | POA: Insufficient documentation

## 2024-11-14 DIAGNOSIS — R002 Palpitations: Secondary | ICD-10-CM | POA: Insufficient documentation

## 2024-11-14 NOTE — ED Triage Notes (Addendum)
 Pt reports feeling like his heart is racing. Reports hx anxiety attacks but states this feels different. States he did start feeling SHOB but that has went away.
